# Patient Record
Sex: Male | Born: 1965 | Race: White | Hispanic: No | Marital: Married | Smoking: Current every day smoker
Health system: Southern US, Community
[De-identification: ages and names within clinical notes are randomized; demographics above are authoritative.]

## PROBLEM LIST (undated history)

## (undated) DIAGNOSIS — I10 Essential (primary) hypertension: Secondary | ICD-10-CM

---

## 2007-02-01 ENCOUNTER — Inpatient Hospital Stay: Payer: Self-pay | Admitting: Unknown Physician Specialty

## 2010-09-24 ENCOUNTER — Inpatient Hospital Stay: Payer: Self-pay | Admitting: Unknown Physician Specialty

## 2011-05-05 ENCOUNTER — Ambulatory Visit: Payer: Self-pay | Admitting: Internal Medicine

## 2011-08-03 ENCOUNTER — Ambulatory Visit: Payer: Self-pay | Admitting: Sports Medicine

## 2013-09-07 ENCOUNTER — Ambulatory Visit: Payer: Self-pay | Admitting: Family Medicine

## 2014-11-05 ENCOUNTER — Ambulatory Visit
Admission: EM | Admit: 2014-11-05 | Discharge: 2014-11-05 | Disposition: A | Discharge status: Home / Self Care | Payer: Self-pay | Attending: Internal Medicine | Admitting: Internal Medicine

## 2014-11-05 DIAGNOSIS — J209 Acute bronchitis, unspecified: Secondary | ICD-10-CM

## 2014-11-05 HISTORY — DX: Essential (primary) hypertension: I10

## 2014-11-05 MED ORDER — PHENYLEPH-PROMETHAZINE-COD 5-6.25-10 MG/5ML PO SYRP: 10.0000 mL | ORAL_SOLUTION | Freq: Four times a day (QID) | ORAL | Status: DC | PRN

## 2014-11-05 MED ORDER — PREDNISONE 50 MG PO TABS: 50.0000 mg | ORAL_TABLET | Freq: Every day | ORAL | Status: AC

## 2014-11-05 NOTE — Discharge Instructions (Signed)
Prescriptions for prednisone (for chest tightness/wheezing) and cough syrup.  Anticipate slow improvement over the next couple weeks  Acute Bronchitis Bronchitis is inflammation of the airways that extend from the windpipe into the lungs (bronchi). The inflammation often causes mucus to develop. This leads to a cough, which is the most common symptom of bronchitis.  In acute bronchitis, the condition usually develops suddenly and goes away over time, usually in a couple weeks. Smoking, allergies, and asthma can make bronchitis worse. Repeated episodes of bronchitis may cause further lung problems.  CAUSES Acute bronchitis is most often caused by the same virus that causes a cold. The virus can spread from person to person (contagious) through coughing, sneezing, and touching contaminated objects. SIGNS AND SYMPTOMS   Cough.   Fever.   Coughing up mucus.   Body aches.   Chest congestion.   Chills.   Shortness of breath.   Sore throat.  DIAGNOSIS  Acute bronchitis is usually diagnosed through a physical exam. Your health care provider will also ask you questions about your medical history. Tests, such as chest X-rays, are sometimes done to rule out other conditions.  TREATMENT  Acute bronchitis usually goes away in a couple weeks. Oftentimes, no medical treatment is necessary. Medicines are sometimes given for relief of fever or cough. Antibiotic medicines are usually not needed but may be prescribed in certain situations. In some cases, an inhaler may be recommended to help reduce shortness of breath and control the cough. A cool mist vaporizer may also be used to help thin bronchial secretions and make it easier to clear the chest.  HOME CARE INSTRUCTIONS  Get plenty of rest.   Drink enough fluids to keep your urine clear or pale yellow (unless you have a medical condition that requires fluid restriction). Increasing fluids may help thin your respiratory secretions (sputum)  and reduce chest congestion, and it will prevent dehydration.   Take medicines only as directed by your health care provider.  If you were prescribed an antibiotic medicine, finish it all even if you start to feel better.  Avoid smoking and secondhand smoke. Exposure to cigarette smoke or irritating chemicals will make bronchitis worse. If you are a smoker, consider using nicotine gum or skin patches to help control withdrawal symptoms. Quitting smoking will help your lungs heal faster.   Reduce the chances of another bout of acute bronchitis by washing your hands frequently, avoiding people with cold symptoms, and trying not to touch your hands to your mouth, nose, or eyes.   Keep all follow-up visits as directed by your health care provider.  SEEK MEDICAL CARE IF: Your symptoms do not improve after 1 week of treatment.  SEEK IMMEDIATE MEDICAL CARE IF:  You develop an increased fever or chills.   You have chest pain.   You have severe shortness of breath.  You have bloody sputum.   You develop dehydration.  You faint or repeatedly feel like you are going to pass out.  You develop repeated vomiting.  You develop a severe headache. MAKE SURE YOU:   Understand these instructions.  Will watch your condition.  Will get help right away if you are not doing well or get worse. Document Released: 07/25/2004 Document Revised: 11/01/2013 Document Reviewed: 12/08/2012 [facility] Patient Information 2015 [place], [place]. This information is not intended to replace advice given to you by your health care provider. Make sure you discuss any questions you have with your health care provider.  Viral Infections A viral  infection can be caused by different types of viruses.Most viral infections are not serious and resolve on their own. However, some infections may cause severe symptoms and may lead to further complications. SYMPTOMS Viruses can frequently cause:  Minor sore  throat.  Aches and pains.  Headaches.  Runny nose.  Different types of rashes.  Watery eyes.  Tiredness.  Cough.  Loss of appetite.  Gastrointestinal infections, resulting in nausea, vomiting, and diarrhea. These symptoms do not respond to antibiotics because the infection is not caused by bacteria. However, you might catch a bacterial infection following the viral infection. This is sometimes called a "superinfection." Symptoms of such a bacterial infection may include:  Worsening sore throat with pus and difficulty swallowing.  Swollen neck glands.  Chills and a high or persistent fever.  Severe headache.  Tenderness over the sinuses.  Persistent overall ill feeling (malaise), muscle aches, and tiredness (fatigue).  Persistent cough.  Yellow, green, or brown mucus production with coughing. HOME CARE INSTRUCTIONS   Only take over-the-counter or prescription medicines for pain, discomfort, diarrhea, or fever as directed by your caregiver.  Drink enough water and fluids to keep your urine clear or pale yellow. Sports drinks can provide valuable electrolytes, sugars, and hydration.  Get plenty of rest and maintain proper nutrition. Soups and broths with crackers or rice are fine. SEEK IMMEDIATE MEDICAL CARE IF:   You have severe headaches, shortness of breath, chest pain, neck pain, or an unusual rash.  You have uncontrolled vomiting, diarrhea, or you are unable to keep down fluids.  You or your child has an oral temperature above 102 F (38.9 C), not controlled by medicine.  Your baby is older than 3 months with a rectal temperature of 102 F (38.9 C) or higher.  Your baby is 333 months old or younger with a rectal temperature of 100.4 F (38 C) or higher. MAKE SURE YOU:   Understand these instructions.  Will watch your condition.  Will get help right away if you are not doing well or get worse. Document Released: 03/27/2005 Document Revised: 09/09/2011  Document Reviewed: 10/22/2010 [facility] Patient Information 2015 [place], [place]. This information is not intended to replace advice given to you by your health care provider. Make sure you discuss any questions you have with your health care provider.

## 2014-11-05 NOTE — ED Notes (Signed)
X 3 days. Pt reports was exposed to outside allergens. Is c/o nasal congestion, sinus pain/pressure, h/a.

## 2014-11-05 NOTE — ED Provider Notes (Addendum)
CSN: [id number]     Arrival date & time 11/05/14  1147 History   First MD Initiated Contact with Patient 11/05/14 1218     Chief Complaint  Patient presents with  . URI   HPI Patient presents with 3 days history of cough, chest tightness, headache, fatigue, malaise. Some sinus congestion, some postnasal drainage. Cough productive in the mornings. Very sore throat in the mornings. He feels like he is getting worse, not improving. Tactile temp. He works outside, and had to work outside in the cold drain week.  Past Medical History  Diagnosis Date  . Hypertension    History reviewed. No pertinent past surgical history. Family History  Problem Relation Age of Onset  . Diabetes Father    History  Substance Use Topics  . Smoking status: Former Smoker -- 2.00 packs/day for 26 years    Quit date: 11/04/2009  . Smokeless tobacco: Never Used  . Alcohol Use: 1.2 oz/week    2 Cans of beer per week    Review of Systems  All other systems reviewed and are negative.   Allergies  Review of patient's allergies indicates no known allergies.  Home Medications   Prior to Admission medications   Medication Sig Start Date End Date Taking? Authorizing Provider  metoprolol tartrate (LOPRESSOR) 25 MG tablet Take 25 mg by mouth 2 (two) times daily.   Yes Historical Provider, MD   BP 129/85 mmHg  Pulse 80  Temp(Src) 97.8 F (36.6 C) (Tympanic)  Resp 20  Ht 5\' 9"  (1.753 m)  Wt 210 lb (95.255 kg)  BMI 31.00 kg/m2  SpO2 98% Physical Exam  Constitutional: He is oriented to person, place, and time. No distress.  Alert, nicely groomed  HENT:  Head: Atraumatic.  Bilateral ears waxy, TMs slightly dull, no erythema Moderate nasal congestion Throat is quite red with postnasal drainage  Eyes:  Conjugate gaze, no eye redness/drainage  Neck: Neck supple.  Cardiovascular: Normal rate and regular rhythm.   Pulmonary/Chest: No respiratory distress. He has wheezes (wheezes are appreciated  throughout). He has no rales.  symmetric breath sounds  Abdominal: Soft. He exhibits no distension.  Musculoskeletal: Normal range of motion.  Neurological: He is alert and oriented to person, place, and time.  Skin: Skin is warm and dry.  No cyanosis  Nursing note and vitals reviewed.   ED Course  Procedures  Labs Review  Labs Reviewed - No data to display  Imaging Review No results found.   MDM   1. Acute bronchitis, unspecified organism    Rx for a pulse of prednisone, and some cough syrup. Anticipate slow improvement over the next couple weeks. Recheck for fever, increasing phlegm production.   Eustace MooreLaura W Tabitha Tupper, MD 11/05/14 1248  Call from pharmacist, rx inadvertently written for phenergan with codeine and phenylephrine, which is backordered.  Ok'ed change to phenergan with codeine, same sig.   Ria ClockLaura Keiva Dina MD  Eustace MooreLaura W Eleuterio Dollar, MD 11/07/14 [phone]

## 2014-12-26 ENCOUNTER — Other Ambulatory Visit: Payer: Self-pay | Admitting: Family Medicine

## 2014-12-26 DIAGNOSIS — I10 Essential (primary) hypertension: Secondary | ICD-10-CM

## 2017-03-17 ENCOUNTER — Ambulatory Visit (INDEPENDENT_AMBULATORY_CARE_PROVIDER_SITE_OTHER): Payer: Self-pay | Admitting: Family Medicine

## 2017-03-17 ENCOUNTER — Encounter: Payer: Self-pay | Admitting: Family Medicine

## 2017-03-17 VITALS — BP 130/78 | HR 84 | Ht 69.0 in | Wt 207.0 lb

## 2017-03-17 DIAGNOSIS — Z1211 Encounter for screening for malignant neoplasm of colon: Secondary | ICD-10-CM

## 2017-03-17 DIAGNOSIS — K921 Melena: Secondary | ICD-10-CM

## 2017-03-17 DIAGNOSIS — K6289 Other specified diseases of anus and rectum: Secondary | ICD-10-CM

## 2017-03-17 DIAGNOSIS — N410 Acute prostatitis: Secondary | ICD-10-CM

## 2017-03-17 LAB — HEMOCCULT GUIAC POC 1CARD (OFFICE): Fecal Occult Blood, POC: NEGATIVE

## 2017-03-17 MED ORDER — SULFAMETHOXAZOLE-TRIMETHOPRIM 800-160 MG PO TABS: 1.0000 | ORAL_TABLET | Freq: Two times a day (BID) | ORAL | 0 refills | Status: DC

## 2017-03-17 NOTE — Progress Notes (Signed)
Name: Roger Michael   MRN: [id number]    DOB: Jan 07, 1966   Date:03/17/2017       Progress Note  Subjective  Chief Complaint  Chief Complaint  Patient presents with  . Hemorrhoids    "feels like I'm sitting on a golf ball. Bowel movement feels like it is pushing up against it."- Sharp pain shooting occassionally up through the rectum and back down- "I don't have to be doing anything when that occurs". Job requires alot of lifting. Regular as far as BM/ does not have to strain. After using the bathroom- sometimes it feels like the "lump gets smaller and sometimes I can still feel it"- First cousin had prostate cancer.    Sensation "past the entrance"  Proximal to anus ,can feel stool passing over.    Rectal Bleeding   The current episode started more than 2 weeks ago. The onset was gradual. The problem occurs occasionally. The problem has been unchanged (comes and goes). The pain is mild. There was no prior successful therapy. There was no prior unsuccessful therapy. Associated symptoms include hemorrhoids and rectal pain. Pertinent negatives include no fever, no abdominal pain, no diarrhea, no nausea, no hematuria, no chest pain, no headaches, no coughing and no rash.    No problem-specific Assessment & Plan notes found for this encounter.   Past Medical History:  Diagnosis Date  . Hypertension     No past surgical history on file.  Family History  Problem Relation Age of Onset  . Diabetes Father     Social History   Social History  . Marital status: Married    Spouse name: N/A  . Number of children: N/A  . Years of education: N/A   Occupational History  . Not on file.   Social History Main Topics  . Smoking status: Former Smoker    Packs/day: 2.00    Years: 26.00    Quit date: 11/04/2009  . Smokeless tobacco: Never Used  . Alcohol use 1.2 oz/week    2 Cans of beer per week  . Drug use: No  . Sexual activity: Not Currently   Other Topics Concern  . Not on file    Social History Narrative  . No narrative on file    No Known Allergies  Outpatient Medications Prior to Visit  Medication Sig Dispense Refill  . metoprolol tartrate (LOPRESSOR) 25 MG tablet TAKE 1 (ONE) TABLET BY MOUTH TWO TIMES DAILY 180 tablet 0  . Phenyleph-Promethazine-Cod 5-6.25-10 MG/5ML SYRP Take 10 mLs by mouth 4 (four) times daily as needed. 180 mL 0   No facility-administered medications prior to visit.     Review of Systems  Constitutional: Negative for chills, fever, malaise/fatigue and weight loss.  HENT: Negative for ear discharge, ear pain and sore throat.   Eyes: Negative for blurred vision.  Respiratory: Negative for cough, sputum production, shortness of breath and wheezing.   Cardiovascular: Negative for chest pain, palpitations and leg swelling.  Gastrointestinal: Positive for hematochezia, hemorrhoids and rectal pain. Negative for abdominal pain, blood in stool, constipation, diarrhea, heartburn, melena and nausea.  Genitourinary: Negative for dysuria, frequency, hematuria and urgency.  Musculoskeletal: Negative for back pain, joint pain, myalgias and neck pain.  Skin: Negative for rash.  Neurological: Negative for dizziness, tingling, sensory change, focal weakness and headaches.  Endo/Heme/Allergies: Negative for environmental allergies and polydipsia. Does not bruise/bleed easily.  Psychiatric/Behavioral: Negative for depression and suicidal ideas. The patient is not nervous/anxious and does not have insomnia.  Objective  Vitals:   03/17/17 1409  BP: 130/78  Pulse: 84  Weight: 207 lb (93.9 kg)  Height:  (1.753 m)    Physical Exam  Constitutional: He is oriented to person, place, and time and well-developed, well-nourished, and in no distress.  HENT:  Head: Normocephalic.  Right Ear: External ear normal.  Left Ear: External ear normal.  Nose: Nose normal.  Mouth/Throat: Oropharynx is clear and moist.  Eyes: Pupils are equal, round,  and reactive to light. Conjunctivae and EOM are normal. Right eye exhibits no discharge. Left eye exhibits no discharge. No scleral icterus.  Neck: Normal range of motion. Neck supple. No JVD present. No tracheal deviation present. No thyromegaly present.  Cardiovascular: Normal rate, regular rhythm, normal heart sounds and intact distal pulses.  Exam reveals no gallop and no friction rub.   No murmur heard. Pulmonary/Chest: Breath sounds normal. No respiratory distress. He has no wheezes. He has no rales.  Abdominal: Soft. Bowel sounds are normal. He exhibits no mass. There is no hepatosplenomegaly. There is no tenderness. There is no rebound, no guarding and no CVA tenderness.  Genitourinary: Rectal exam shows no external hemorrhoid, no internal hemorrhoid, no fissure, no mass, no tenderness and guaiac negative stool. Prostate is tender.  Musculoskeletal: Normal range of motion. He exhibits no edema or tenderness.  Lymphadenopathy:    He has no cervical adenopathy.  Neurological: He is alert and oriented to person, place, and time. He has normal sensation, normal strength, normal reflexes and intact cranial nerves. No cranial nerve deficit.  Skin: Skin is warm. No rash noted.  Psychiatric: Mood and affect normal.  Nursing note and vitals reviewed.     Assessment & Plan  Problem List Items Addressed This Visit    None    Visit Diagnoses    Acute prostatitis    -  Primary   Hematochezia       Relevant Orders   Ambulatory referral to Gastroenterology   POCT occult blood stool (Completed)   Rectal discomfort       mass vs prostatitis   Relevant Orders   Ambulatory referral to Gastroenterology   Colon cancer screening       Relevant Orders   POCT occult blood stool (Completed)      Meds ordered this encounter  Medications  . sulfamethoxazole-trimethoprim (BACTRIM DS,SEPTRA DS) 800-160 MG tablet    Sig: Take 1 tablet by mouth 2 (two) times daily.    Dispense:  28 tablet     Refill:  0      Dr. Elizabeth Sauer [id number] [facility]  03/17/17

## 2017-03-19 ENCOUNTER — Encounter: Payer: Self-pay | Admitting: Gastroenterology

## 2017-04-14 ENCOUNTER — Ambulatory Visit: Payer: Self-pay | Admitting: Gastroenterology

## 2017-04-14 ENCOUNTER — Other Ambulatory Visit: Payer: Self-pay

## 2017-12-22 ENCOUNTER — Encounter: Payer: Self-pay | Admitting: Family Medicine

## 2017-12-22 ENCOUNTER — Ambulatory Visit (INDEPENDENT_AMBULATORY_CARE_PROVIDER_SITE_OTHER): Payer: Self-pay | Admitting: Family Medicine

## 2017-12-22 VITALS — BP 140/90 | HR 84 | Ht 69.0 in | Wt 212.0 lb

## 2017-12-22 DIAGNOSIS — R81 Glycosuria: Secondary | ICD-10-CM

## 2017-12-22 DIAGNOSIS — N3090 Cystitis, unspecified without hematuria: Secondary | ICD-10-CM

## 2017-12-22 LAB — POCT URINALYSIS DIPSTICK
Bilirubin, UA: NEGATIVE
Glucose, UA: POSITIVE — AB
Ketones, UA: NEGATIVE
Leukocytes, UA: NEGATIVE
NITRITE UA: NEGATIVE
PROTEIN UA: POSITIVE — AB
Spec Grav, UA: 1.025 (ref 1.010–1.025)
Urobilinogen, UA: 0.2 E.U./dL
pH, UA: 6.5 (ref 5.0–8.0)

## 2017-12-22 LAB — POCT CBG (FASTING - GLUCOSE)-MANUAL ENTRY: Glucose Fasting, POC: 189 mg/dL — AB (ref 70–99)

## 2017-12-22 MED ORDER — DOXYCYCLINE HYCLATE 100 MG PO TABS: 100.0000 mg | ORAL_TABLET | Freq: Two times a day (BID) | ORAL | 0 refills | Status: DC

## 2017-12-22 NOTE — Progress Notes (Signed)
Name: Roger Michael   MRN: [id number]    DOB: [birth year]   Date:12/22/2017       Progress Note  Subjective  Chief Complaint  Chief Complaint  Patient presents with  . Urinary Tract Infection    "peeing blood today"    Urinary Tract Infection   This is a new (hematuria) problem. The current episode started yesterday. The problem occurs intermittently. The problem has been gradually improving. The quality of the pain is described as burning. The pain is at a severity of 2/10. The pain is moderate. There has been no fever. He is not sexually active. There is no history of pyelonephritis. Associated symptoms include hematuria. Pertinent negatives include no chills, discharge, flank pain, frequency, hesitancy, nausea, sweats or urgency. The treatment provided moderate relief. His past medical history is significant for kidney stones. hx of prostste infection    No problem-specific Assessment & Plan notes found for this encounter.   Past Medical History:  Diagnosis Date  . Hypertension     History reviewed. No pertinent surgical history.  Family History  Problem Relation Age of Onset  . Diabetes Father     Social History   Socioeconomic History  . Marital status: Married    Spouse name: Not on file  . Number of children: Not on file  . Years of education: Not on file  . Highest education level: Not on file  Occupational History  . Not on file  Social Needs  . Financial resource strain: Not on file  . Food insecurity:    Worry: Not on file    Inability: Not on file  . Transportation needs:    Medical: Not on file    Non-medical: Not on file  Tobacco Use  . Smoking status: Former Smoker    Packs/day: 2.00    Years: 26.00    Pack years: 52.00    Last attempt to quit: 11/04/2009    Years since quitting: 8.1  . Smokeless tobacco: Never Used  Substance and Sexual Activity  . Alcohol use: Yes    Alcohol/week: 1.2 oz    Types: 2 Cans of beer per week  . Drug use: No  .  Sexual activity: Not Currently  Lifestyle  . Physical activity:    Days per week: Not on file    Minutes per session: Not on file  . Stress: Not on file  Relationships  . Social connections:    Talks on phone: Not on file    Gets together: Not on file    Attends religious service: Not on file    Active member of club or organization: Not on file    Attends meetings of clubs or organizations: Not on file    Relationship status: Not on file  . Intimate partner violence:    Fear of current or ex partner: Not on file    Emotionally abused: Not on file    Physically abused: Not on file    Forced sexual activity: Not on file  Other Topics Concern  . Not on file  Social History Narrative  . Not on file    No Known Allergies  Outpatient Medications Prior to Visit  Medication Sig Dispense Refill  . metoprolol tartrate (LOPRESSOR) 25 MG tablet Take by mouth.    . Phenyleph-Promethazine-Cod 5-6.25-10 MG/5ML SYRP Take 10 mLs by mouth 4 (four) times daily as needed. 180 mL 0  . sulfamethoxazole-trimethoprim (BACTRIM DS,SEPTRA DS) 800-160 MG tablet Take 1 tablet by mouth  2 (two) times daily. 28 tablet 0   No facility-administered medications prior to visit.     Review of Systems  Constitutional: Negative for chills, fever, malaise/fatigue and weight loss.  HENT: Negative for ear discharge, ear pain and sore throat.   Eyes: Negative for blurred vision.  Respiratory: Negative for cough, sputum production, shortness of breath and wheezing.   Cardiovascular: Negative for chest pain, palpitations and leg swelling.  Gastrointestinal: Negative for abdominal pain, blood in stool, constipation, diarrhea, heartburn, melena and nausea.  Genitourinary: Positive for hematuria. Negative for dysuria, flank pain, frequency, hesitancy and urgency.  Musculoskeletal: Negative for back pain, joint pain, myalgias and neck pain.  Skin: Negative for rash.  Neurological: Negative for dizziness, tingling,  sensory change, focal weakness and headaches.  Endo/Heme/Allergies: Negative for environmental allergies and polydipsia. Does not bruise/bleed easily.  Psychiatric/Behavioral: Negative for depression and suicidal ideas. The patient is not nervous/anxious and does not have insomnia.      Objective  Vitals:   12/22/17 1101  BP: 140/90  Pulse: 84  Weight: 212 lb (96.2 kg)  Height: 5\' 9"  (1.753 m)    Physical Exam  Constitutional: He is oriented to person, place, and time.  HENT:  Head: Normocephalic.  Right Ear: External ear normal.  Left Ear: External ear normal.  Nose: Nose normal.  Mouth/Throat: Oropharynx is clear and moist.  Eyes: Pupils are equal, round, and reactive to light. Conjunctivae and EOM are normal. Right eye exhibits no discharge. Left eye exhibits no discharge. No scleral icterus.  Neck: Normal range of motion. Neck supple. No JVD present. No tracheal deviation present. No thyromegaly present.  Cardiovascular: Normal rate, regular rhythm, normal heart sounds and intact distal pulses. PMI is not displaced. Exam reveals no gallop and no friction rub.  No murmur heard. Pulmonary/Chest: Breath sounds normal. No respiratory distress. He has no wheezes. He has no rales. He exhibits no tenderness.  Abdominal: Soft. Bowel sounds are normal. He exhibits no mass. There is no hepatosplenomegaly. There is no tenderness. There is no rigidity, no rebound, no guarding and no CVA tenderness.  No suprapubic tenderness  Genitourinary: Testes normal. Right testis shows no mass, no swelling and no tenderness. Right testis is descended. Cremasteric reflex is not absent on the right side. Left testis shows no mass, no swelling and no tenderness. Left testis is descended. Cremasteric reflex is not absent on the left side.  Musculoskeletal: Normal range of motion. He exhibits no edema or tenderness.  Lymphadenopathy:    He has no cervical adenopathy.  Neurological: He is alert and oriented  to person, place, and time. He has normal strength and normal reflexes. No cranial nerve deficit.  Skin: Skin is warm. No rash noted.  Nursing note and vitals reviewed.     Assessment & Plan  Problem List Items Addressed This Visit    None    Visit Diagnoses    Cystitis    -  Primary   treated with Doxy/ repeat urine in 2 weeks after treatment   Relevant Medications   doxycycline (VIBRA-TABS) 100 MG tablet   Other Relevant Orders   POCT urinalysis dipstick (Completed)   Glucosuria       finger stick was 99mg /dl   Relevant Orders   POCT CBG (Fasting - Glucose) (Completed)      Meds ordered this encounter  Medications  . doxycycline (VIBRA-TABS) 100 MG tablet    Sig: Take 1 tablet (100 mg total) by mouth 2 (two) times daily.  Dispense:  30 tablet    Refill:  0  Repeat glucose 99mg /dl    Dr. Elizabeth Sauer [facility]  12/22/17

## 2018-01-27 ENCOUNTER — Encounter: Payer: Self-pay | Admitting: Family Medicine

## 2018-01-27 ENCOUNTER — Ambulatory Visit (INDEPENDENT_AMBULATORY_CARE_PROVIDER_SITE_OTHER): Payer: Self-pay | Admitting: Family Medicine

## 2018-01-27 VITALS — BP 126/80 | HR 84 | Ht 69.0 in | Wt 210.0 lb

## 2018-01-27 DIAGNOSIS — R310 Gross hematuria: Secondary | ICD-10-CM | POA: Insufficient documentation

## 2018-01-27 DIAGNOSIS — R1011 Right upper quadrant pain: Secondary | ICD-10-CM

## 2018-01-27 DIAGNOSIS — R81 Glycosuria: Secondary | ICD-10-CM | POA: Insufficient documentation

## 2018-01-27 LAB — POCT URINALYSIS DIPSTICK
BILIRUBIN UA: NEGATIVE
Glucose, UA: POSITIVE — AB
Ketones, UA: NEGATIVE
LEUKOCYTES UA: NEGATIVE
Nitrite, UA: NEGATIVE
PROTEIN UA: NEGATIVE
Spec Grav, UA: 1.015 (ref 1.010–1.025)
UROBILINOGEN UA: 0.2 U/dL
pH, UA: 6 (ref 5.0–8.0)

## 2018-01-27 LAB — GLUCOSE, POCT (MANUAL RESULT ENTRY): POC Glucose: 115 mg/dl — AB (ref 70–99)

## 2018-01-27 NOTE — Patient Instructions (Signed)

## 2018-01-27 NOTE — Progress Notes (Signed)
Name: Roger Michael   MRN: [id number]    DOB: 06/18/1966   Date:01/27/2018       Progress Note  Subjective  Chief Complaint  Chief Complaint  Patient presents with  . Follow-up    hematuria and elevated glucose in urine    Patient has persistent glucosuria /with 2 hr pc 115mg %  Hematuria  This is a new problem. The current episode started more than 1 month ago (gross hematuria/ last visit). The problem has been waxing and waning since onset. He describes the hematuria as gross (today microscopic) hematuria. His pain is at a severity of 0/10. He describes his urine color as yellow. Irritative symptoms do not include frequency, nocturia or urgency. Obstructive symptoms do not include dribbling, incomplete emptying, an intermittent stream, a slower stream, straining or a weak stream. Pertinent negatives include no abdominal pain, chills, dysuria, facial swelling, fever, flank pain, genital pain, hesitancy, inability to urinate, nausea or vomiting. He is not sexually active. His past medical history is significant for kidney stones. There is no history of BPH, GU trauma, hypertension, prostatitis, recent infection, sickle cell disease, STDs or tobacco use.    Gross hematuria Reccheck of previous gross hematuria. U/A positive for blood and glucose. Will check BUN/creatinine. iF NORMAL  Next step if creatinine normal referral to urology.  Glucosuria Large glucose to urine /will check a1c  and microalbuminuria. Verify fingerstick glucose with venous sample.   Past Medical History:  Diagnosis Date  . Hypertension     No past surgical history on file.  Family History  Problem Relation Age of Onset  . Diabetes Father     Social History   Socioeconomic History  . Marital status: Married    Spouse name: Not on file  . Number of children: Not on file  . Years of education: Not on file  . Highest education level: Not on file  Occupational History  . Not on file  Social Needs  .  Financial resource strain: Not on file  . Food insecurity:    Worry: Not on file    Inability: Not on file  . Transportation needs:    Medical: Not on file    Non-medical: Not on file  Tobacco Use  . Smoking status: Former Smoker    Packs/day: 2.00    Years: 26.00    Pack years: 52.00    Last attempt to quit: 11/04/2009    Years since quitting: 8.2  . Smokeless tobacco: Never Used  Substance and Sexual Activity  . Alcohol use: Yes    Alcohol/week: 1.2 oz    Types: 2 Cans of beer per week  . Drug use: No  . Sexual activity: Not Currently  Lifestyle  . Physical activity:    Days per week: Not on file    Minutes per session: Not on file  . Stress: Not on file  Relationships  . Social connections:    Talks on phone: Not on file    Gets together: Not on file    Attends religious service: Not on file    Active member of club or organization: Not on file    Attends meetings of clubs or organizations: Not on file    Relationship status: Not on file  . Intimate partner violence:    Fear of current or ex partner: Not on file    Emotionally abused: Not on file    Physically abused: Not on file    Forced sexual activity: Not on  file  Other Topics Concern  . Not on file  Social History Narrative  . Not on file    No Known Allergies  Outpatient Medications Prior to Visit  Medication Sig Dispense Refill  . metoprolol tartrate (LOPRESSOR) 25 MG tablet Take by mouth.    . doxycycline (VIBRA-TABS) 100 MG tablet Take 1 tablet (100 mg total) by mouth 2 (two) times daily. 30 tablet 0   No facility-administered medications prior to visit.     Review of Systems  Constitutional: Negative for chills, fever, malaise/fatigue and weight loss.  HENT: Negative for ear discharge, ear pain, facial swelling and sore throat.   Eyes: Negative for blurred vision.  Respiratory: Negative for cough, sputum production, shortness of breath and wheezing.   Cardiovascular: Negative for chest pain,  palpitations and leg swelling.  Gastrointestinal: Negative for abdominal pain, blood in stool, constipation, diarrhea, heartburn, melena, nausea and vomiting.  Genitourinary: Positive for hematuria. Negative for dysuria, flank pain, frequency, hesitancy, incomplete emptying, nocturia and urgency.  Musculoskeletal: Negative for back pain, joint pain, myalgias and neck pain.  Skin: Negative for rash.  Neurological: Negative for dizziness, tingling, sensory change, focal weakness and headaches.  Endo/Heme/Allergies: Negative for environmental allergies and polydipsia. Does not bruise/bleed easily.  Psychiatric/Behavioral: Negative for depression and suicidal ideas. The patient is not nervous/anxious and does not have insomnia.      Objective  Vitals:   01/27/18 1424  BP: 126/80  Pulse: 84  Weight: 210 lb (95.3 kg)  Height: 5\' 9"  (1.753 m)    Physical Exam  Constitutional: He is oriented to person, place, and time.  HENT:  Head: Normocephalic.  Right Ear: External ear normal.  Left Ear: External ear normal.  Nose: Nose normal.  Mouth/Throat: Oropharynx is clear and moist.  Eyes: Pupils are equal, round, and reactive to light. Conjunctivae and EOM are normal. Right eye exhibits no discharge. Left eye exhibits no discharge. No scleral icterus.  Neck: Normal range of motion. Neck supple. No JVD present. No tracheal deviation present. No thyromegaly present.  Cardiovascular: Normal rate, regular rhythm, normal heart sounds and intact distal pulses. Exam reveals no gallop and no friction rub.  No murmur heard. Pulmonary/Chest: Breath sounds normal. No respiratory distress. He has no wheezes. He has no rales.  Abdominal: Soft. Bowel sounds are normal. He exhibits no mass. There is no hepatosplenomegaly. There is no tenderness. There is no rebound, no guarding and no CVA tenderness.  Musculoskeletal: Normal range of motion. He exhibits no edema or tenderness.  Lymphadenopathy:    He has no  cervical adenopathy.  Neurological: He is alert and oriented to person, place, and time. He has normal strength and normal reflexes. No cranial nerve deficit.  Skin: Skin is warm. No rash noted.  Nursing note and vitals reviewed.     Assessment & Plan  Problem List Items Addressed This Visit      Genitourinary   Gross hematuria - Primary    Reccheck of previous gross hematuria. U/A positive for blood and glucose. Will check BUN/creatinine. iF NORMAL  Next step if creatinine normal referral to urology.      Relevant Orders   POCT urinalysis dipstick (Completed)   Renal Function Panel   Microalbumin / creatinine urine ratio     Other   Glucosuria    Large glucose to urine /will check a1c  and microalbuminuria. Verify fingerstick glucose with venous sample.      Relevant Orders   POCT Glucose (CBG)  Hemoglobin A1c   Renal Function Panel   Microalbumin / creatinine urine ratio      No orders of the defined types were placed in this encounter.     Dr. Hayden [facility]  01/27/18

## 2018-01-27 NOTE — Assessment & Plan Note (Signed)
Large glucose to urine /will check a1c  and microalbuminuria. Verify fingerstick glucose with venous sample.

## 2018-01-27 NOTE — Assessment & Plan Note (Signed)
Reccheck of previous gross hematuria. U/A positive for blood and glucose. Will check BUN/creatinine. iF NORMAL  Next step if creatinine normal referral to urology.

## 2018-01-28 LAB — HEMOGLOBIN A1C
Est. average glucose Bld gHb Est-mCnc: 134 mg/dL
Hgb A1c MFr Bld: 6.3 % — ABNORMAL HIGH (ref 4.8–5.6)

## 2018-01-28 LAB — RENAL FUNCTION PANEL
Albumin: 4.8 g/dL (ref 3.5–5.5)
BUN/Creatinine Ratio: 16 (ref 9–20)
BUN: 15 mg/dL (ref 6–24)
CO2: 26 mmol/L (ref 20–29)
Calcium: 9.7 mg/dL (ref 8.7–10.2)
Chloride: 100 mmol/L (ref 96–106)
Creatinine, Ser: 0.93 mg/dL (ref 0.76–1.27)
GFR calc Af Amer: 110 mL/min/{1.73_m2} (ref >59)
GFR calc non Af Amer: 95 mL/min/{1.73_m2} (ref >59)
GLUCOSE: 72 mg/dL (ref 65–99)
PHOSPHORUS: 3.6 mg/dL (ref 2.5–4.5)
POTASSIUM: 4.2 mmol/L (ref 3.5–5.2)
Sodium: 142 mmol/L (ref 134–144)

## 2018-01-28 LAB — MICROALBUMIN / CREATININE URINE RATIO
Creatinine, Urine: 134.6 mg/dL
MICROALB/CREAT RATIO: 4.9 mg/g{creat} (ref 0.0–30.0)
MICROALBUM., U, RANDOM: 6.6 ug/mL

## 2018-01-28 NOTE — Addendum Note (Signed)
Addended by: Everitt AmberLYNCH, Chanele Douglas L on: 01/28/2018 04:11 PM   Modules accepted: Orders

## 2018-01-29 ENCOUNTER — Ambulatory Visit
Admission: RE | Admit: 2018-01-29 | Discharge: 2018-01-29 | Disposition: A | Discharge status: Home / Self Care | Payer: Self-pay | Source: Ambulatory Visit | Attending: Family Medicine | Admitting: Family Medicine

## 2018-01-29 DIAGNOSIS — N200 Calculus of kidney: Secondary | ICD-10-CM | POA: Insufficient documentation

## 2018-01-29 DIAGNOSIS — N281 Cyst of kidney, acquired: Secondary | ICD-10-CM | POA: Insufficient documentation

## 2018-01-29 DIAGNOSIS — R1011 Right upper quadrant pain: Secondary | ICD-10-CM | POA: Insufficient documentation

## 2018-01-29 DIAGNOSIS — I700 Atherosclerosis of aorta: Secondary | ICD-10-CM | POA: Insufficient documentation

## 2018-01-29 DIAGNOSIS — R310 Gross hematuria: Secondary | ICD-10-CM | POA: Insufficient documentation

## 2018-01-30 ENCOUNTER — Other Ambulatory Visit: Payer: Self-pay

## 2018-01-30 DIAGNOSIS — K529 Noninfective gastroenteritis and colitis, unspecified: Secondary | ICD-10-CM

## 2019-02-07 ENCOUNTER — Emergency Department: Payer: Self-pay

## 2019-02-07 ENCOUNTER — Encounter: Payer: Self-pay | Admitting: Emergency Medicine

## 2019-02-07 ENCOUNTER — Emergency Department
Admission: EM | Admit: 2019-02-07 | Discharge: 2019-02-07 | Disposition: A | Discharge status: Home / Self Care | Payer: Self-pay | Attending: Student | Admitting: Student

## 2019-02-07 ENCOUNTER — Other Ambulatory Visit: Payer: Self-pay

## 2019-02-07 ENCOUNTER — Encounter: Payer: Self-pay | Admitting: Intensive Care

## 2019-02-07 ENCOUNTER — Ambulatory Visit
Admission: EM | Admit: 2019-02-07 | Discharge: 2019-02-07 | Disposition: A | Discharge status: Home / Self Care | Payer: Self-pay

## 2019-02-07 DIAGNOSIS — Z87891 Personal history of nicotine dependence: Secondary | ICD-10-CM | POA: Insufficient documentation

## 2019-02-07 DIAGNOSIS — K611 Rectal abscess: Secondary | ICD-10-CM | POA: Insufficient documentation

## 2019-02-07 DIAGNOSIS — K6289 Other specified diseases of anus and rectum: Secondary | ICD-10-CM

## 2019-02-07 DIAGNOSIS — Z79899 Other long term (current) drug therapy: Secondary | ICD-10-CM | POA: Insufficient documentation

## 2019-02-07 LAB — COMPREHENSIVE METABOLIC PANEL
ALT: 22 U/L (ref 0–44)
AST: 18 U/L (ref 15–41)
Albumin: 4.3 g/dL (ref 3.5–5.0)
Alkaline Phosphatase: 53 U/L (ref 38–126)
Anion gap: 8 (ref 5–15)
BUN: 11 mg/dL (ref 6–20)
CO2: 27 mmol/L (ref 22–32)
Calcium: 9.6 mg/dL (ref 8.9–10.3)
Chloride: 103 mmol/L (ref 98–111)
Creatinine, Ser: 0.95 mg/dL (ref 0.61–1.24)
GFR calc Af Amer: >60 mL/min (ref >60)
GFR calc non Af Amer: >60 mL/min (ref >60)
Glucose, Bld: 122 mg/dL — ABNORMAL HIGH (ref 70–99)
Potassium: 4.5 mmol/L (ref 3.5–5.1)
Sodium: 138 mmol/L (ref 135–145)
Total Bilirubin: 0.9 mg/dL (ref 0.3–1.2)
Total Protein: 7.9 g/dL (ref 6.5–8.1)

## 2019-02-07 LAB — CBC WITH DIFFERENTIAL/PLATELET
Abs Immature Granulocytes: 0.04 10*3/uL (ref 0.00–0.07)
Basophils Absolute: 0.1 10*3/uL (ref 0.0–0.1)
Basophils Relative: 1 %
Eosinophils Absolute: 0.2 10*3/uL (ref 0.0–0.5)
Eosinophils Relative: 2 %
HCT: 45.8 % (ref 39.0–52.0)
Hemoglobin: 15.1 g/dL (ref 13.0–17.0)
Immature Granulocytes: 1 %
Lymphocytes Relative: 17 %
Lymphs Abs: 1.4 10*3/uL (ref 0.7–4.0)
MCH: 29.7 pg (ref 26.0–34.0)
MCHC: 33 g/dL (ref 30.0–36.0)
MCV: 90.2 fL (ref 80.0–100.0)
Monocytes Absolute: 0.7 10*3/uL (ref 0.1–1.0)
Monocytes Relative: 9 %
Neutro Abs: 6 10*3/uL (ref 1.7–7.7)
Neutrophils Relative %: 70 %
Platelets: 218 10*3/uL (ref 150–400)
RBC: 5.08 MIL/uL (ref 4.22–5.81)
RDW: 12.8 % (ref 11.5–15.5)
WBC: 8.4 10*3/uL (ref 4.0–10.5)
nRBC: 0 % (ref 0.0–0.2)

## 2019-02-07 MED ORDER — METRONIDAZOLE 500 MG PO TABS: 500.0000 mg | ORAL_TABLET | Freq: Three times a day (TID) | ORAL | 0 refills | Status: AC

## 2019-02-07 MED ORDER — IOHEXOL 300 MG/ML  SOLN
100.0000 mL | Freq: Once | INTRAMUSCULAR | Status: AC | PRN
  Administered 2019-02-07: 100 mL via INTRAVENOUS

## 2019-02-07 MED ORDER — CIPROFLOXACIN HCL 500 MG PO TABS: 500.0000 mg | ORAL_TABLET | Freq: Two times a day (BID) | ORAL | 0 refills | Status: AC

## 2019-02-07 NOTE — Discharge Instructions (Signed)
Leave urgent care and proceed to ED for evaluation of concerns for perirectal abscess.   Honor Loh, MSN, APRN, FNP-C, CEN Advanced Practice Provider [facility] 02/07/2019 9:22 AM

## 2019-02-07 NOTE — ED Triage Notes (Signed)
Patient sent from Urgent care for a deep abscess near anal wall. They told patient he needs CT scan and believes he needs surgery. Reports feeling pain/swelling/tightness near rectum since Thursday. Denies drainage. No discomfort during BM.

## 2019-02-07 NOTE — ED Triage Notes (Signed)
Patient c/o rectal pain and painful knot at his anus that started on Thursday.  Patient denies painful bowel movement or rectal bleeding.

## 2019-02-07 NOTE — ED Provider Notes (Signed)
[place]   Name: Roger Michael DOB: 07/15/1965 MRN: [id number] CSN: [id number] PCP: Juline Patch, MD  Arrival date and time:  02/07/19 0845  Chief Complaint:  Rectal Pain   NOTE: Prior to seeing the patient today, I have reviewed the triage nursing documentation and vital signs. Clinical staff has updated patient's PMH/PSHx, current medication list, and drug allergies/intolerances to ensure comprehensive history available to assist in medical decision making.   History:   HPI: Roger Michael is a 53 y.o. male who presents today with complaints of peri-rectal pain that began with acute onset on Thursday (02/04/2019). Patient advising that his pain has progressively worsened since the onset. Patient reporting a "knot" to the LEFT side of his anus that is causing significant pain with any degree of direct pressure. Pain unable to find comfortable seated position and has been having to lay on his side. PMH (+) for external hemorrhoids. Patient initially thought that pain was related to his hemorrhoids, however after him "self checking" he notes that his known hemorrhoid was not painful or bleeding. Patient denies increased pain associated with his bowel movements. He has not appreciated any drainage from the area of concern. Patient has not experienced any associated fevers.   History reviewed. No pertinent past medical history.  History reviewed. No pertinent surgical history.  Family History  Problem Relation Age of Onset   Diabetes Father     Social History   Tobacco Use   Smoking status: Former Smoker    Packs/day: 2.00    Years: 26.00    Pack years: 52.00    Quit date: 11/04/2009    Years since quitting: 9.2   Smokeless tobacco: Never Used  Substance Use Topics   Alcohol use: Yes    Alcohol/week: 16.0 standard drinks    Types: 16 Cans of beer per week   Drug use: No    Patient Active Problem List   Diagnosis Date Noted   Gross hematuria 01/27/2018    Glucosuria 01/27/2018    Home Medications:    No outpatient medications have been marked as taking for the 02/07/19 encounter [facility] Encounter).    Allergies:   Patient has no known allergies.  Review of Systems (ROS): Review of Systems  Constitutional: Negative for chills and fever.  Respiratory: Negative for cough and shortness of breath.   Cardiovascular: Negative for chest pain and palpitations.  Gastrointestinal: Positive for rectal pain. Negative for abdominal pain, anal bleeding, blood in stool, constipation, diarrhea, nausea and vomiting.  Musculoskeletal: Negative for back pain, myalgias and neck pain.  Skin: Negative for color change, pallor and rash.  Neurological: Negative for dizziness, syncope, weakness and headaches.  Psychiatric/Behavioral: The patient is nervous/anxious.   All other systems reviewed and are negative.    Vital Signs: Today's Vitals   02/07/19 0856 02/07/19 0900 02/07/19 0932  BP:  135/81   Pulse:  77   Resp:  16   Temp:  98.5 F (36.9 C)   TempSrc:  Oral   SpO2:  99%   Weight: 200 lb (90.7 kg)    Height: 5\' 10"  (1.778 m)    PainSc: 3   0-No pain    Physical Exam: Physical Exam  Constitutional: He is oriented to person, place, and time and well-developed, well-nourished, and in no distress.  HENT:  Head: Normocephalic and atraumatic.  Mouth/Throat: Mucous membranes are normal.  Eyes: Pupils are equal, round, and reactive to light. EOM are normal.  Cardiovascular: Normal rate,  regular rhythm, normal heart sounds and intact distal pulses. Exam reveals no gallop and no friction rub.  No murmur heard. Pulmonary/Chest: Effort normal and breath sounds normal. No respiratory distress. He has no wheezes. He has no rales.  Abdominal: He exhibits no distension. There is no abdominal tenderness.  Genitourinary: Rectum:     Tenderness and external hemorrhoid present.     Genitourinary Comments: Subcentimeter area of induration and tenderness to  left perirectal area at approximately the 9 o'clock position. Area is deep; no external erythema, warmth, or fluctuance.  There is no observed drainage.    Neurological: He is alert and oriented to person, place, and time. Gait normal. GCS score is 15.  Skin: Skin is warm and dry. No rash noted.  Psychiatric: Memory, affect and judgment normal. His mood appears anxious.  Nursing note and vitals reviewed.   Urgent Care Treatments / Results:   LABS: PLEASE NOTE: all labs that were ordered this encounter are listed, however only abnormal results are displayed. Labs Reviewed - No data to display  EKG: -None  RADIOLOGY: -None   PROCEDURES: Procedures  MEDICATIONS RECEIVED THIS VISIT: Medications - No data to display  PERTINENT CLINICAL COURSE NOTES/UPDATES:   Initial Impression / Assessment and Plan / Urgent Care Course:  Pertinent labs & imaging results that were available during my care of the patient were personally reviewed by me and considered in my medical decision making (see lab/imaging section of note for values and interpretations).  Roger Michael is a 53 y.o. male who [facility] today with complaints of peri-rectal pain.   Patient is well appearing overall in clinic today. He does not appear to be in any acute distress. Presenting symptoms (see HPI) and exam as documented above.  Based on exam, there is concern for a peri-rectal abscess formation. Area is deep, which unfortunately means that it is not amenable to drainage in the urgent care setting.  Discussed need for imaging to determine location, presence of significant fluid collection (abscess), and the proximity to the rectal wall and the invasion thereof.  We discussed that peri-rectal abscesses are generally treated by a general surgeon. Advised patient that this could be done on an outpatient basis through a referral to surgery, however patient advising that he would rather proceed to definitive  care in efforts to prevent further complications and worsening pain.  Discussed alternative option of having patient proceed to the emergency department for further evaluation and imaging.  Patient advises that he would feel better if things were evaluated further on a more immediate basis as compared to waiting to be seen on an outpatient basis.  Patient expresses wishes to be seen in the emergency department today.  Report called to [facility] charge nurse Earlene Plater(Davis, RN).  Charge nurse advised on presenting symptoms, assessment, and discharge plan of care to her facility.  She was advised to return call with any questions and/or concerns related to the care that Roger Michael received today in the urgent care setting.  Final Clinical Impressions / Urgent Care Diagnoses:   Final diagnoses:  Rectal pain    New Prescriptions:  [place] Controlled Substance Registry consulted? Not Applicable  No orders of the defined types were placed in this encounter.   Recommended Follow up Care:  Patient encouraged to follow up with the following provider within the specified time frame, or sooner as dictated by the severity of his symptoms. As always, he was instructed that for any urgent/emergent  care needs, he should seek care either here or in the emergency department for more immediate evaluation.  Follow-up Information    Go to  [facility] EMERGENCY DEPARTMENT.   Specialty: Emergency Medicine Contact information: [address] [id number] [place] [id number] [phone]        NOTE: This note was prepared using Dragon dictation software along with smaller phrase technology. Despite my best ability to proofread, there is the potential that transcriptional errors may still occur from this process, and are completely unintentional.     Verlee MonteGray, Stepfon E, NP 02/07/19 1729

## 2019-02-07 NOTE — ED Provider Notes (Signed)
[facility] Emergency Department Provider Note  ____________________________________________   First MD Initiated Contact with Patient 02/07/19 1114     (approximate)  I have reviewed the triage vital signs and the nursing notes.  History  Chief Complaint No chief complaint on file.    HPI Roger Michael is a 53 y.o. male with no significant medical history who presents emergency department with concern for perirectal abscess.  Patient states since Thursday he has had rectal pain and subjective swelling that he can palpate, as well as discomfort with sitting.  He reports normal daily bowel movements.  No drainage.  No bleeding.  Was evaluated in urgent care and referred here for further evaluation. No history of prior. Symptoms currently mild in severity. No radiation.   He denies any fevers, cough, respiratory symptoms, or sick contacts.        Past Medical Hx History reviewed. No pertinent past medical history.  Problem List Patient Active Problem List   Diagnosis Date Noted  . Gross hematuria 01/27/2018  . Glucosuria 01/27/2018    Past Surgical Hx History reviewed. No pertinent surgical history.  Medications Prior to Admission medications   Medication Sig Start Date End Date Taking? Authorizing Provider  metoprolol tartrate (LOPRESSOR) 25 MG tablet Take by mouth.  02/07/19  [provider]    Allergies Patient has no known allergies.  Family Hx Family History  Problem Relation Age of Onset  . Diabetes Father     Social Hx Social History   Tobacco Use  . Smoking status: Former Smoker    Packs/day: 2.00    Years: 26.00    Pack years: 52.00    Quit date: 11/04/2009    Years since quitting: 9.2  . Smokeless tobacco: Never Used  Substance Use Topics  . Alcohol use: Yes    Alcohol/week: 16.0 standard drinks    Types: 16 Cans of beer per week  . Drug use: No     Review of Systems  Constitutional: Negative for  fever. Negative for chills. Eyes: Negative for visual changes. ENT: Negative for sore throat. Cardiovascular: Negative for chest pain. Respiratory: Negative for shortness of breath. Gastrointestinal: Negative for abdominal pain. Negative for nausea. Negative for vomiting. Genitourinary: + rectal pain Musculoskeletal: Negative for leg swelling. Skin: Negative for rash. Neurological: Negative for for headaches.   Physical Exam  Vital Signs: ED Triage Vitals  Enc Vitals Group     BP 02/07/19 1013 (!) 142/95     Pulse Rate 02/07/19 1013 88     Resp 02/07/19 1013 16     Temp 02/07/19 1013 99 F (37.2 C)     Temp Source 02/07/19 1013 Oral     SpO2 02/07/19 1013 99 %     Weight 02/07/19 1014 210 lb (95.3 kg)     Height 02/07/19 1014 5\' 10"  (1.778 m)     Head Circumference --      Peak Flow --      Pain Score 02/07/19 1014 5     Pain Loc --      Pain Edu? --      Excl. in GC? --     Constitutional: Alert and oriented.  Eyes: Conjunctivae clear. Sclera anicteric. Head: Normocephalic. Atraumatic. Nose: No congestion. No rhinorrhea. Mouth/Throat: Mucous membranes are moist.  Neck: No stridor.   Cardiovascular: Normal rate, regular rhythm. No murmurs. Extremities well perfused. Respiratory: Normal respiratory effort.  Lungs CTAB. Gastrointestinal: Soft and non-tender. No distention.  Rectal:  On digital rectal exam, there is a very small (fingertip in size) L sided perirectal area of induration and tenderness. No drainage. No external erythema, warmth, induration, or fluctuance. Skin tag on external exam. No external hemorrhoids.  Musculoskeletal: No lower extremity edema. Neurologic:  Normal speech and language. No gross focal neurologic deficits are appreciated.  Skin: Skin is warm, dry and intact. No rash noted. Psychiatric: Mood and affect are appropriate for situation.  EKG  N/A  Radiology  CT:  IMPRESSION: 1. There is inflammation to the left of the anus. The  inflammation appears to extend superiorly, posterior to the rectum as described above. There appears to be a small pocket of fluid to the left of the anus measuring 14 x 10 mm consistent with a small abscess. The suspected extension posterior to the rectum could represent inflammation/phlegmon versus early abscess.  Procedures  Procedure(s) performed (including critical care):  Procedures   Initial Impression / Assessment and Plan / ED Course  53 y.o. male with no significant medical history who presents to the ED with concern for peri-rectal abscess.   On exam, he has a small area of induration and tenderness to the left of the anus on DRE. No external erythema/induration/fluctuance. Will obtain imaging for further characterization.   CT with small area of inflammation to the left of the anus.  Reviewed images with surgery, no organized collection amenable to drainage at this time.  Labs reassuring.  We will plan for discharge on course of antibiotics.  Discussed strict return precautions with patient, who voices understanding.  Advised PCP follow-up for recheck.  He is comfortable with the plan and discharge.   Final Clinical Impression(s) / ED Diagnosis  Final diagnoses:  Rectal pain     Note:  This document was prepared using Dragon voice recognition software and may include unintentional dictation errors.   Lilia Pro., MD 02/07/19 [phone]

## 2019-02-07 NOTE — Discharge Instructions (Signed)
Thank you for letting us take care of you in the emergency department today.   New medications we have prescribed:  Ciprofloxacin Metronidazole  Please take these as directed. Do not drink alcohol while on it.   You may take over the counter ibuprofen or Tylenol to help with pain.   Please follow up with your primary care doctor to review your ER visit and follow up on your symptoms.   Please return to the ER for any new or worsening symptoms, including worsening pain, inability to have a bowel movement, fever, vomiting, inability to take your medications, drainage from the infection site, etc.

## 2020-01-21 IMAGING — CT CT PELVIS WITH CONTRAST
2 of 3 series · 17 of 46 positions shown, 19 images · IV contrast (omnipaque)
Comparison: January 29, 2018

CLINICAL DATA: Assess for perianal and perirectal abscesses.

EXAM:
CT PELVIS WITH CONTRAST
TECHNIQUE: Multidetector CT imaging of the pelvis was performed using the
standard protocol following the bolus administration of intravenous
contrast.
CONTRAST:  100mL OMNIPAQUE IOHEXOL 300 MG/ML  SOLN

[Series 3: axial st · axial · 0.86mm/px · z∈[-1148,-846]mm · 14 of 175 slices shown, 16 images]
[im 12/175  soft-tissue]
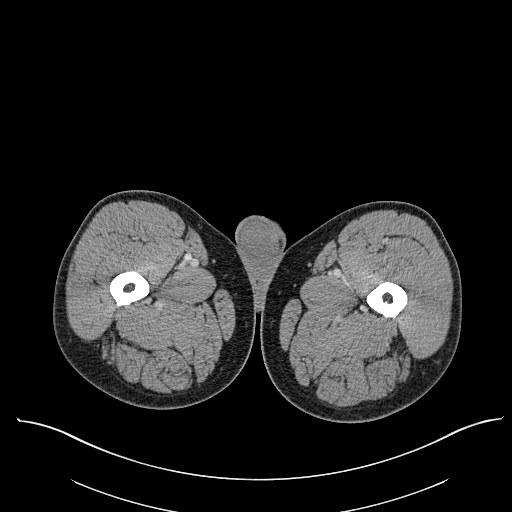
[im 12/175  bone]
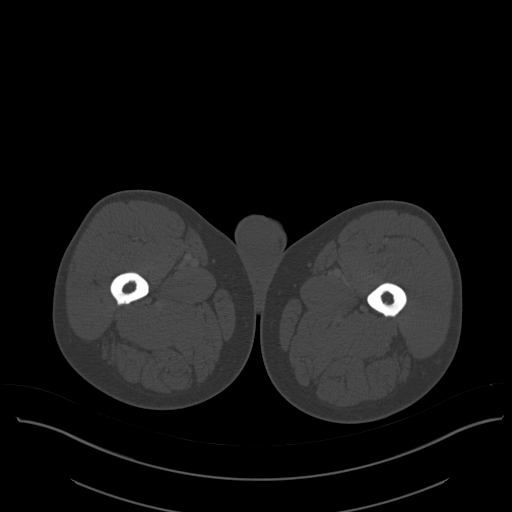
[im 23/175  soft-tissue]
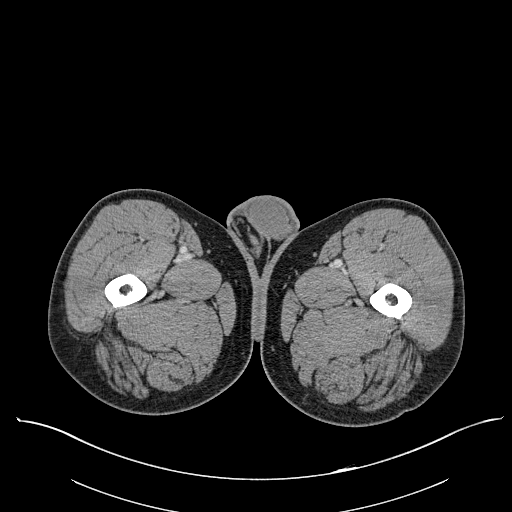
[im 34/175  soft-tissue]
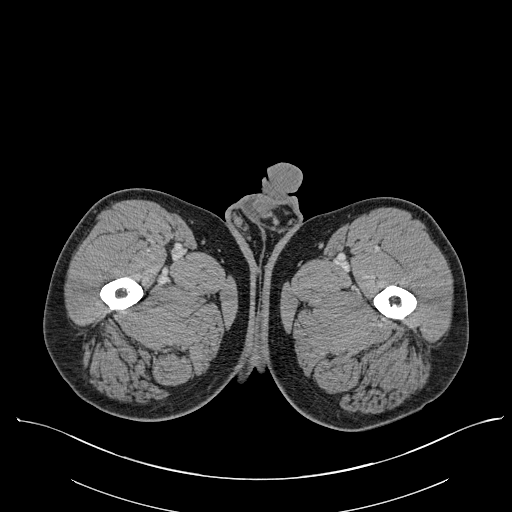
[im 45/175  soft-tissue]
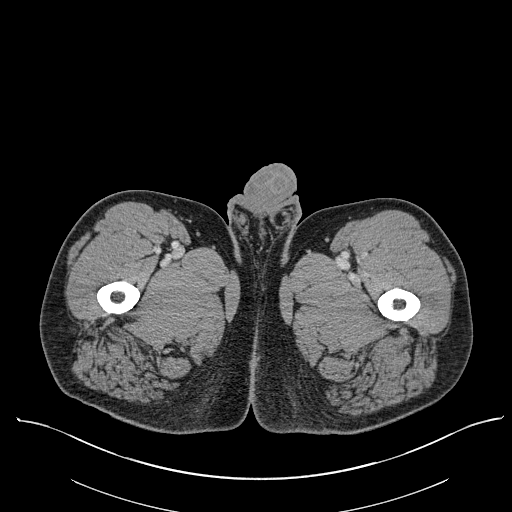
[im 57/175  soft-tissue]
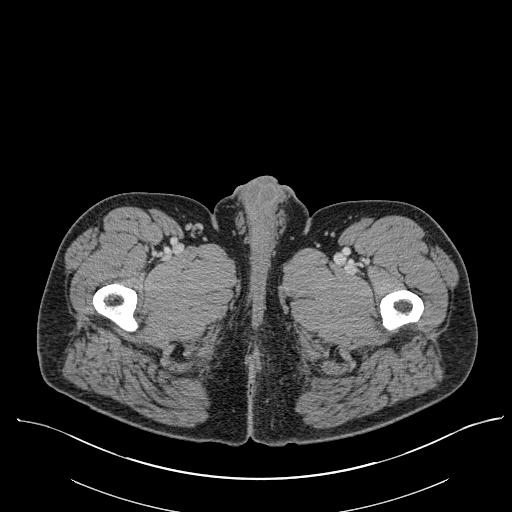
[im 68/175  soft-tissue]
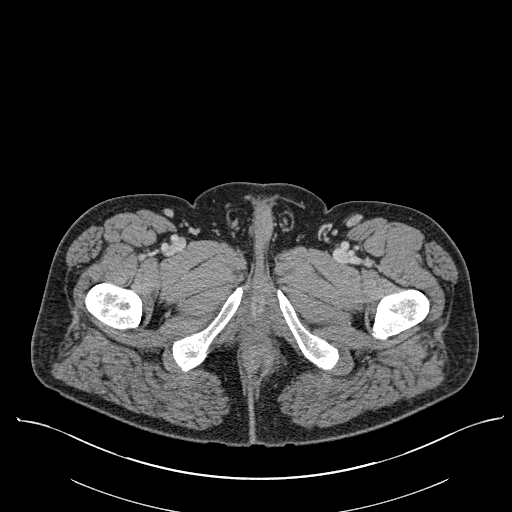
[im 79/175  soft-tissue]
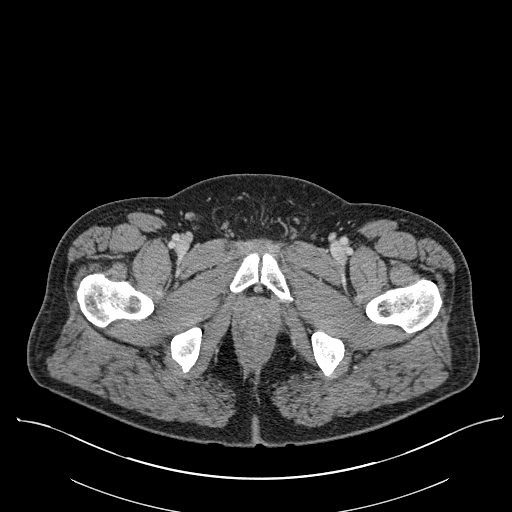
[im 96/175  soft-tissue]
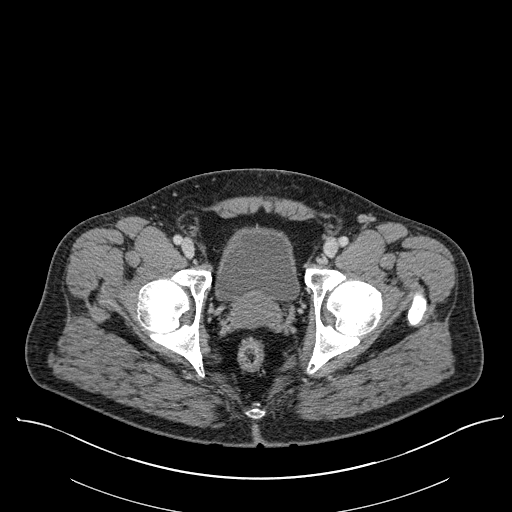
[im 107/175  soft-tissue]
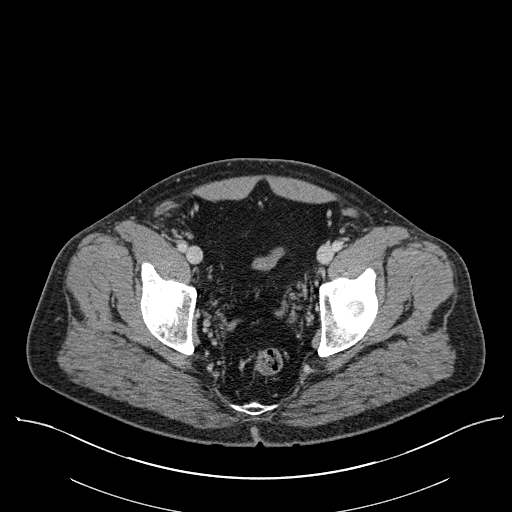
[im 107/175  bone]
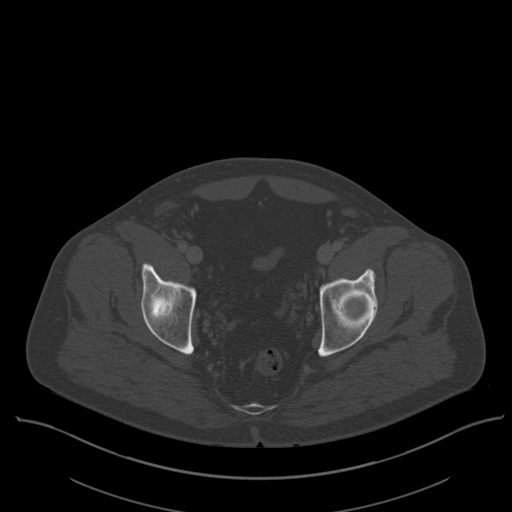
[im 118/175  soft-tissue]
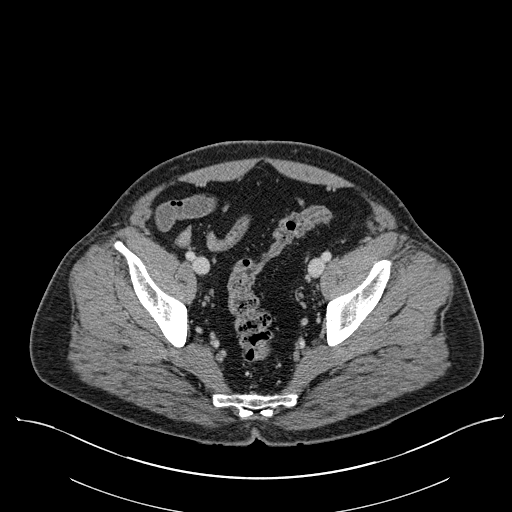
[im 130/175  soft-tissue]
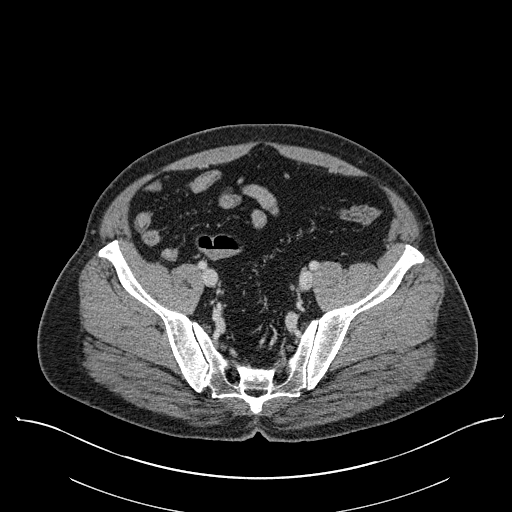
[im 141/175  soft-tissue]
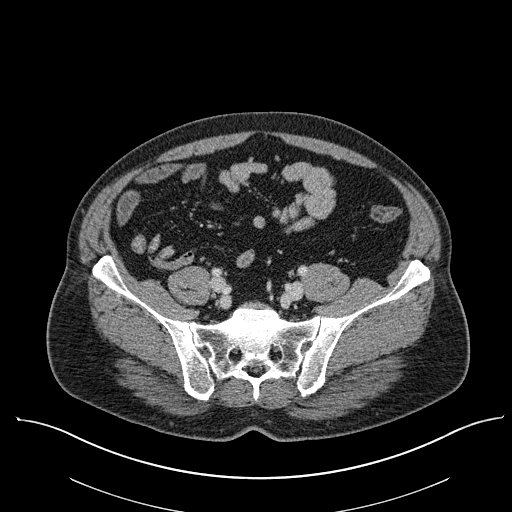
[im 152/175  soft-tissue]
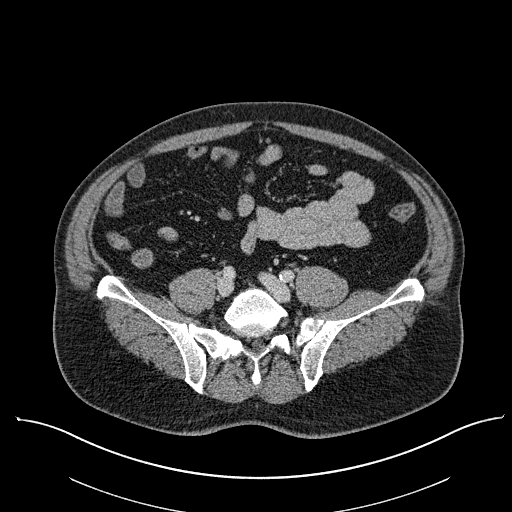
[im 163/175  soft-tissue]
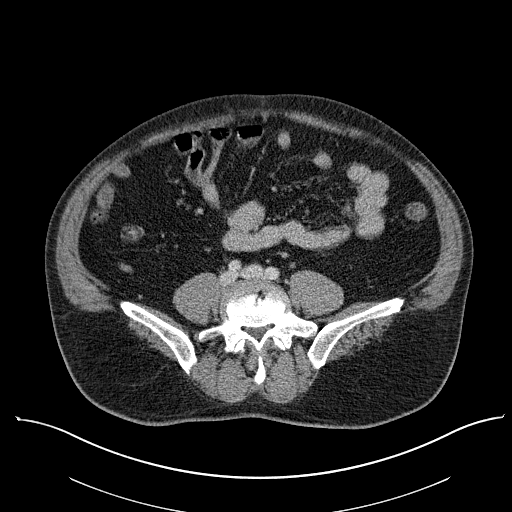

[Series 6: coronal st · coronal · 0.69mm/px · 3 of 139 slices shown]
[im 47/139  soft-tissue]
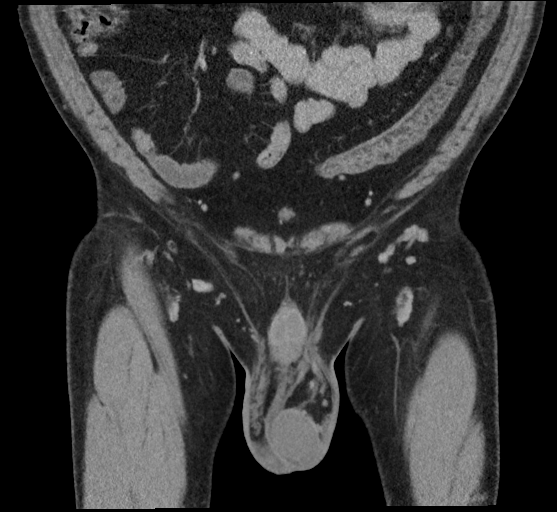
[im 62/139  soft-tissue]
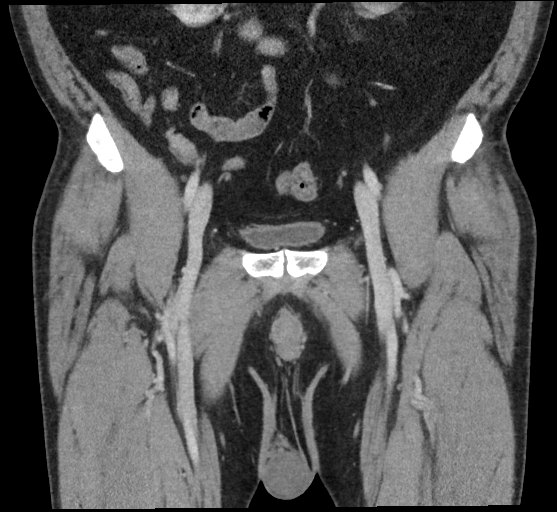
[im 77/139  soft-tissue]
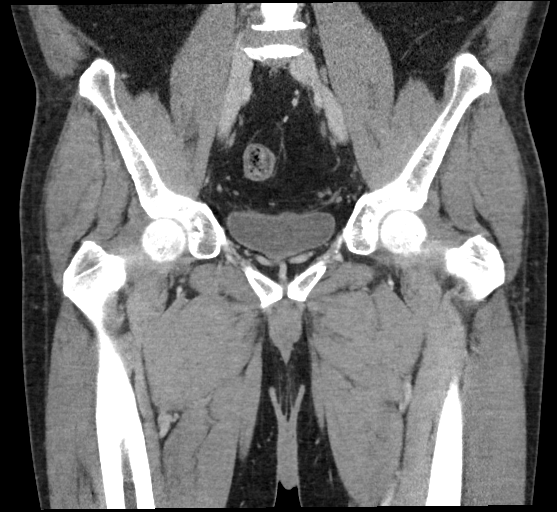

[17 of 46 positions shown; findings below may reference images not displayed]

FINDINGS: Urinary Tract:  No abnormality visualized.

Bowel:  Unremarkable visualized pelvic bowel loops.

Vascular/Lymphatic: No pathologically enlarged lymph nodes. No
significant vascular abnormality seen.

Reproductive:  No mass or other significant abnormality

Other: There is inflammation to the left of the anus as seen on
axial image 111. I suspect a small pocket of fluid on this image
measuring 14 x 10 mm the inflammation I am suspicious that the
inflammation extends superiorly, posterior to the rectum with
increased soft tissue attenuation posterior to the rectum as seen on
axial image 93 and sagittal image 90. It is unclear whether this
increased attenuation represents inflammation or fluid.

Musculoskeletal: No suspicious bone lesions identified.
IMPRESSION: 1. There is inflammation to the left of the anus. The inflammation
appears to extend superiorly, posterior to the rectum as described
above. There appears to be a small pocket of fluid to the left of
the anus measuring 14 x 10 mm consistent with a small abscess. The
suspected extension posterior to the rectum could represent
inflammation/phlegmon versus early abscess.

## 2022-03-05 ENCOUNTER — Ambulatory Visit: Payer: Self-pay | Admitting: *Deleted

## 2022-03-05 NOTE — Telephone Encounter (Signed)
Summary: COVID Positve Advice   Pt tested positive for COVID test today and tested positve today. Sx started Saturday morning. Sx include: Headache that wil not go away, Sore throat, Back Pain, Leg weakness, temperature.     Attempted to call patient- no answer- message call can not be completed at this time- please try later.

## 2022-03-05 NOTE — Telephone Encounter (Signed)
  Chief Complaint: tested positive for covid today requesting medication  Symptoms: headache, sore throat, body aches possible fever Frequency: sx started 03/02/22 Pertinent Negatives: Patient denies chest pain , no difficulty breathing.  Disposition: [] ED /[] Urgent Care (no appt availability in office) / [x] Appointment(In office/virtual)/ []  [facility]/ [] Home Care/ [] Refused Recommended Disposition /[] [facility]/ []  Follow-up with PCP Additional Notes:   Requesting if antiviral medication can be sent to pharmacy. My Chart VV scheduled for tomorrow.       Reason for Disposition  [1] COVID-19 diagnosed by positive lab test (e.g., PCR, rapid self-test kit) AND [2] mild symptoms (e.g., cough, fever, others) AND [4] no complications or SOB  Answer Assessment - Initial Assessment Questions 1. COVID-19 DIAGNOSIS: "How do you know that you have COVID?" (e.g., positive lab test or self-test, diagnosed by doctor or NP/PA, symptoms after exposure).     Covid positive test today 2. COVID-19 EXPOSURE: "Was there any known exposure to COVID before the symptoms began?" CDC Definition of close contact: within 6 feet (2 meters) for a total of 15 minutes or more over a 24-hour period.      na 3. ONSET: "When did the COVID-19 symptoms start?"      Saturday 03/02/22 4. WORST SYMPTOM: "What is your worst symptom?" (e.g., cough, fever, shortness of breath, muscle aches)     Headache sore throat, body pain fever 5. COUGH: "Do you have a cough?" If Yes, ask: "How bad is the cough?"       na 6. FEVER: "Do you have a fever?" If Yes, ask: "What is your temperature, how was it measured, and when did it start?"     No thermometer available  7. RESPIRATORY STATUS: "Describe your breathing?" (e.g., normal; shortness of breath, wheezing, unable to speak)      Ok  8. BETTER-SAME-WORSE: "Are you getting better, staying the same or getting worse compared to yesterday?"  If getting worse, ask, "In  what way?"     na 9. OTHER SYMPTOMS: "Do you have any other symptoms?"  (e.g., chills, fatigue, headache, loss of smell or taste, muscle pain, sore throat)     See above  10. HIGH RISK DISEASE: "Do you have any chronic medical problems?" (e.g., asthma, heart or lung disease, weak immune system, obesity, etc.)       na 11. VACCINE: "Have you had the COVID-19 vaccine?" If Yes, ask: "Which one, how many shots, when did you get it?"       Yes  12. PREGNANCY: "Is there any chance you are pregnant?" "When was your last menstrual period?"       na 13. O2 SATURATION MONITOR:  "Do you use an oxygen saturation monitor (pulse oximeter) at home?" If Yes, ask "What is your reading (oxygen level) today?" "What is your usual oxygen saturation reading?" (e.g., 95%)       na  Protocols used: Coronavirus (COVID-19) Diagnosed or Suspected-A-AH

## 2022-03-06 ENCOUNTER — Telehealth: Payer: Self-pay | Admitting: Family Medicine

## 2022-03-06 ENCOUNTER — Encounter: Payer: Self-pay | Admitting: Emergency Medicine

## 2022-03-06 ENCOUNTER — Ambulatory Visit
Admission: EM | Admit: 2022-03-06 | Discharge: 2022-03-06 | Disposition: A | Discharge status: Home / Self Care | Payer: BC Managed Care – PPO | Attending: Physician Assistant | Admitting: Physician Assistant

## 2022-03-06 ENCOUNTER — Other Ambulatory Visit: Payer: Self-pay

## 2022-03-06 DIAGNOSIS — U071 COVID-19: Secondary | ICD-10-CM | POA: Diagnosis not present

## 2022-03-06 DIAGNOSIS — R5383 Other fatigue: Secondary | ICD-10-CM | POA: Diagnosis not present

## 2022-03-06 DIAGNOSIS — R051 Acute cough: Secondary | ICD-10-CM | POA: Diagnosis not present

## 2022-03-06 MED ORDER — MOLNUPIRAVIR 200 MG PO CAPS: 4.0000 | ORAL_CAPSULE | Freq: Two times a day (BID) | ORAL | 0 refills | Status: AC

## 2022-03-06 MED ORDER — PSEUDOEPH-BROMPHEN-DM 30-2-10 MG/5ML PO SYRP: 10.0000 mL | ORAL_SOLUTION | Freq: Four times a day (QID) | ORAL | 0 refills | Status: AC | PRN

## 2022-03-06 NOTE — ED Provider Notes (Signed)
[facility]    CSN: [id number] Arrival date & time: 03/06/22  7408      History   Chief Complaint Chief Complaint  Patient presents with   Covid Positive   Cough    HPI Roger Michael is a 56 y.o. male presenting for onset of subjective fever, fatigue, headaches, body aches, cough, congestion, sore throat on Saturday evening.  He reports that he took a COVID test and was positive.  He says that he has been resting and taking ibuprofen for his headache but the headache is not gone away.  He also reports a lot of aching in his back.  Patient states that he was told by his primary care provider's nurse that he may be able to get antiviral medication.  He has not been taking any other OTC meds.  His medical history is significant for every day tobacco use and obesity but he has no history of hypertension, hyperlipidemia, diabetes, heart disease, respiratory issues, autoimmune processes.  He has had 2 COVID vaccines but no boosters.   HPI  History reviewed. No pertinent past medical history.  Patient Active Problem List   Diagnosis Date Noted   Gross hematuria 01/27/2018   Glucosuria 01/27/2018    History reviewed. No pertinent surgical history.     Home Medications    Prior to Admission medications   Medication Sig Start Date End Date Taking? Authorizing Provider  brompheniramine-pseudoephedrine-DM 30-2-10 MG/5ML syrup Take 10 mLs by mouth 4 (four) times daily as needed for up to 7 days. 03/06/22 03/13/22 Yes Shirlee Latch, PA-C  molnupiravir EUA (LAGEVRIO) 200 MG CAPS capsule Take 4 capsules (800 mg total) by mouth 2 (two) times daily for 5 days. 03/06/22 03/11/22 Yes Eusebio Friendly B, PA-C  metoprolol tartrate (LOPRESSOR) 25 MG tablet Take by mouth.  02/07/19  [provider]    Family History Family History  Problem Relation Age of Onset   Diabetes Father     Social History Social History   Tobacco Use   Smoking status: Every Day    Packs/day:  2.00    Years: 26.00    Total pack years: 52.00    Types: Cigarettes    Last attempt to quit: 11/04/2009    Years since quitting: 12.3   Smokeless tobacco: Never  Vaping Use   Vaping Use: Never used  Substance Use Topics   Alcohol use: Yes    Alcohol/week: 16.0 standard drinks of alcohol    Types: 16 Cans of beer per week   Drug use: No     Allergies   Patient has no known allergies.   Review of Systems Review of Systems  Constitutional:  Positive for fatigue and fever.  HENT:  Positive for congestion, rhinorrhea and sore throat. Negative for sinus pressure and sinus pain.   Respiratory:  Positive for cough. Negative for shortness of breath.   Cardiovascular:  Negative for chest pain.  Gastrointestinal:  Negative for abdominal pain, diarrhea, nausea and vomiting.  Musculoskeletal:  Positive for myalgias.  Neurological:  Positive for weakness and headaches. Negative for light-headedness.  Hematological:  Negative for adenopathy.     Physical Exam Triage Vital Signs ED Triage Vitals  Enc Vitals Group     BP      Pulse      Resp      Temp      Temp src      SpO2      Weight  Height      Head Circumference      Peak Flow      Pain Score      Pain Loc      Pain Edu?      Excl. in GC?    No data found.  Updated Vital Signs BP (!) 158/87 (BP Location: Left Arm)   Pulse 96   Temp 99.5 F (37.5 C) (Oral)   Resp 16   Ht 5\' 10"  (1.778 m)   Wt 210 lb 1.6 oz (95.3 kg)   SpO2 98%   BMI 30.15 kg/m     Physical Exam Vitals and nursing note reviewed.  Constitutional:      General: He is not in acute distress.    Appearance: Normal appearance. He is well-developed. He is ill-appearing.  HENT:     Head: Normocephalic and atraumatic.     Nose: Congestion present.     Mouth/Throat:     Mouth: Mucous membranes are moist.     Pharynx: Oropharynx is clear. Posterior oropharyngeal erythema present.  Eyes:     General: No scleral icterus.    Conjunctiva/sclera:  Conjunctivae normal.  Cardiovascular:     Rate and Rhythm: Normal rate and regular rhythm.     Heart sounds: Normal heart sounds.  Pulmonary:     Effort: Pulmonary effort is normal. No respiratory distress.     Breath sounds: Normal breath sounds.  Musculoskeletal:     Cervical back: Neck supple.  Skin:    General: Skin is warm and dry.     Capillary Refill: Capillary refill takes less than 2 seconds.  Neurological:     General: No focal deficit present.     Mental Status: He is alert. Mental status is at baseline.     Motor: No weakness.     Gait: Gait normal.  Psychiatric:        Mood and Affect: Mood normal.        Behavior: Behavior normal.      UC Treatments / Results  Labs (all labs ordered are listed, but only abnormal results are displayed) Labs Reviewed - No data to display  EKG   Radiology No results found.  Procedures Procedures (including critical care time)  Medications Ordered in UC Medications - No data to display  Initial Impression / Assessment and Plan / UC Course  I have reviewed the triage vital signs and the nursing notes.  Pertinent labs & imaging results that were available during my care of the patient were reviewed by me and considered in my medical decision making (see chart for details).   56 year old male with a positive at home COVID test presents for fever, fatigue, body aches, sore throat, cough and congestion for the past 3 days.  Medical history significant for tobacco abuse and obesity.  He also has elevated blood pressure in the clinic today but no diagnosis of hypertension in the past.  BP is 158/87.  Other vitals normal and stable.  He is mildly ill-appearing but nontoxic.  On exam he has nasal congestion and erythema posterior pharynx.  Chest clear auscultation heart regular rate rhythm.  I asked patient if he wanted another COVID test but explained that if his home test was positive and he has all of the symptoms, that would be  sufficient.  He says he does not need it for anything and declined to test.  Reviewed current CDC guidelines, isolation protocol and ED precautions for COVID-19.  Patient does  qualify for antiviral medications as sent molnupiravir to pharmacy as well as Bromfed-DM and encouraged him to rest and increase fluids.  Reviewed return and ER precautions.   Final Clinical Impressions(s) / UC Diagnoses   Final diagnoses:  COVID-19  Acute cough  Other fatigue     Discharge Instructions      -You need to isolate until Saturday and then you may come out of isolation but wear a mask for the next 5 days.  I have sent antiviral medication to the pharmacy as well as cough medicine.  You should continue to rest increase your fluids. - You should be feeling better within 7 to 10 days.  However, if you feel a lot worse or have any difficulty breathing, please return for reevaluation.     ED Prescriptions     Medication Sig Dispense Auth. Provider   molnupiravir EUA (LAGEVRIO) 200 MG CAPS capsule Take 4 capsules (800 mg total) by mouth 2 (two) times daily for 5 days. 40 capsule Eusebio Friendly B, PA-C   brompheniramine-pseudoephedrine-DM 30-2-10 MG/5ML syrup Take 10 mLs by mouth 4 (four) times daily as needed for up to 7 days. 150 mL Shirlee Latch, PA-C      PDMP not reviewed this encounter.   Shirlee Latch, PA-C 03/06/22 [phone]

## 2022-03-06 NOTE — Discharge Instructions (Addendum)
-  You need to isolate until Saturday and then you may come out of isolation but wear a mask for the next 5 days.  I have sent antiviral medication to the pharmacy as well as cough medicine.  You should continue to rest increase your fluids. - You should be feeling better within 7 to 10 days.  However, if you feel a lot worse or have any difficulty breathing, please return for reevaluation.

## 2022-03-06 NOTE — ED Triage Notes (Signed)
Pt c/o cough, sore throat, body aches, fever, headache, and weakness. Started about 3 days ago. He states he took a home covid test and was positive.
